# Patient Record
Sex: Female | Born: 1950 | Race: Black or African American | Hispanic: No | Marital: Married | Smoking: Former smoker
Health system: Southern US, Community
[De-identification: ages and names within clinical notes are randomized; demographics above are authoritative.]

## PROBLEM LIST (undated history)

## (undated) DIAGNOSIS — K56609 Unspecified intestinal obstruction, unspecified as to partial versus complete obstruction: Secondary | ICD-10-CM

## (undated) DIAGNOSIS — I10 Essential (primary) hypertension: Secondary | ICD-10-CM

## (undated) DIAGNOSIS — N186 End stage renal disease: Secondary | ICD-10-CM

## (undated) DIAGNOSIS — Z992 Dependence on renal dialysis: Secondary | ICD-10-CM

## (undated) DIAGNOSIS — I509 Heart failure, unspecified: Secondary | ICD-10-CM

## (undated) DIAGNOSIS — E785 Hyperlipidemia, unspecified: Secondary | ICD-10-CM

## (undated) DIAGNOSIS — E119 Type 2 diabetes mellitus without complications: Secondary | ICD-10-CM

## (undated) HISTORY — PX: COLONOSCOPY W/ POLYPECTOMY: SHX1380

---

## 2016-10-23 ENCOUNTER — Ambulatory Visit (HOSPITAL_COMMUNITY)
Admission: RE | Admit: 2016-10-23 | Discharge: 2016-10-23 | Disposition: A | Discharge status: Home / Self Care | Payer: Medicare Other | Source: Ambulatory Visit | Attending: Interventional Radiology | Admitting: Interventional Radiology

## 2016-10-23 ENCOUNTER — Ambulatory Visit (HOSPITAL_COMMUNITY)
Admission: RE | Admit: 2016-10-23 | Payer: Medicare Other | Source: Other Acute Inpatient Hospital | Admitting: Interventional Radiology

## 2016-10-23 ENCOUNTER — Encounter (HOSPITAL_COMMUNITY): Payer: Self-pay | Admitting: Interventional Radiology

## 2016-10-23 ENCOUNTER — Other Ambulatory Visit (HOSPITAL_COMMUNITY): Payer: Self-pay | Admitting: Interventional Radiology

## 2016-10-23 DIAGNOSIS — Z4901 Encounter for fitting and adjustment of extracorporeal dialysis catheter: Secondary | ICD-10-CM | POA: Diagnosis present

## 2016-10-23 DIAGNOSIS — Z992 Dependence on renal dialysis: Secondary | ICD-10-CM

## 2016-10-23 DIAGNOSIS — N186 End stage renal disease: Secondary | ICD-10-CM | POA: Diagnosis not present

## 2016-10-23 HISTORY — PX: IR REMOVAL TUN CV CATH W/O FL: IMG2289

## 2016-10-23 MED ORDER — CHLORHEXIDINE GLUCONATE 4 % EX LIQD
CUTANEOUS | Status: AC
  Filled 2016-10-23: qty 15

## 2016-10-23 MED ORDER — LIDOCAINE HCL (PF) 1 % IJ SOLN
INTRAMUSCULAR | Status: DC | PRN
  Administered 2016-10-23: 5 mL

## 2016-10-23 MED ORDER — LIDOCAINE HCL 1 % IJ SOLN
INTRAMUSCULAR | Status: AC
  Filled 2016-10-23: qty 20

## 2016-10-23 NOTE — Procedures (Signed)
Successful removal of tunneled HD catheter EBL: None No immediate complications.  Jay Samatha Anspach, MD Pager #: [phone]   

## 2016-11-29 ENCOUNTER — Emergency Department (HOSPITAL_BASED_OUTPATIENT_CLINIC_OR_DEPARTMENT_OTHER): Payer: Medicare Other

## 2016-11-29 ENCOUNTER — Encounter (HOSPITAL_BASED_OUTPATIENT_CLINIC_OR_DEPARTMENT_OTHER): Payer: Self-pay | Admitting: *Deleted

## 2016-11-29 ENCOUNTER — Emergency Department (HOSPITAL_BASED_OUTPATIENT_CLINIC_OR_DEPARTMENT_OTHER)
Admission: EM | Admit: 2016-11-29 | Discharge: 2016-11-29 | Disposition: A | Discharge status: Home / Self Care | Payer: Medicare Other | Attending: Emergency Medicine | Admitting: Emergency Medicine

## 2016-11-29 ENCOUNTER — Other Ambulatory Visit: Payer: Self-pay

## 2016-11-29 DIAGNOSIS — R05 Cough: Secondary | ICD-10-CM | POA: Diagnosis present

## 2016-11-29 DIAGNOSIS — I509 Heart failure, unspecified: Secondary | ICD-10-CM | POA: Diagnosis not present

## 2016-11-29 DIAGNOSIS — Z79899 Other long term (current) drug therapy: Secondary | ICD-10-CM | POA: Diagnosis not present

## 2016-11-29 DIAGNOSIS — E1122 Type 2 diabetes mellitus with diabetic chronic kidney disease: Secondary | ICD-10-CM | POA: Diagnosis not present

## 2016-11-29 DIAGNOSIS — I132 Hypertensive heart and chronic kidney disease with heart failure and with stage 5 chronic kidney disease, or end stage renal disease: Secondary | ICD-10-CM | POA: Diagnosis not present

## 2016-11-29 DIAGNOSIS — Z7982 Long term (current) use of aspirin: Secondary | ICD-10-CM | POA: Insufficient documentation

## 2016-11-29 DIAGNOSIS — J181 Lobar pneumonia, unspecified organism: Secondary | ICD-10-CM | POA: Insufficient documentation

## 2016-11-29 DIAGNOSIS — J189 Pneumonia, unspecified organism: Secondary | ICD-10-CM

## 2016-11-29 DIAGNOSIS — Z794 Long term (current) use of insulin: Secondary | ICD-10-CM | POA: Diagnosis not present

## 2016-11-29 DIAGNOSIS — R531 Weakness: Secondary | ICD-10-CM | POA: Diagnosis not present

## 2016-11-29 DIAGNOSIS — N186 End stage renal disease: Secondary | ICD-10-CM | POA: Diagnosis not present

## 2016-11-29 HISTORY — DX: Type 2 diabetes mellitus without complications: E11.9

## 2016-11-29 HISTORY — DX: Hyperlipidemia, unspecified: E78.5

## 2016-11-29 HISTORY — DX: Dependence on renal dialysis: Z99.2

## 2016-11-29 HISTORY — DX: End stage renal disease: N18.6

## 2016-11-29 HISTORY — DX: Unspecified intestinal obstruction, unspecified as to partial versus complete obstruction: K56.609

## 2016-11-29 HISTORY — DX: Heart failure, unspecified: I50.9

## 2016-11-29 HISTORY — DX: Essential (primary) hypertension: I10

## 2016-11-29 LAB — CBC WITH DIFFERENTIAL/PLATELET
BASOS ABS: 0.1 10*3/uL (ref 0.0–0.1)
BASOS PCT: 1 %
Eosinophils Absolute: 0.3 10*3/uL (ref 0.0–0.7)
Eosinophils Relative: 5 %
HEMATOCRIT: 34 % — AB (ref 36.0–46.0)
HEMOGLOBIN: 10.7 g/dL — AB (ref 12.0–15.0)
Lymphocytes Relative: 19 %
Lymphs Abs: 1.1 10*3/uL (ref 0.7–4.0)
MCH: 26.8 pg (ref 26.0–34.0)
MCHC: 31.5 g/dL (ref 30.0–36.0)
MCV: 85 fL (ref 78.0–100.0)
MONO ABS: 0.6 10*3/uL (ref 0.1–1.0)
Monocytes Relative: 11 %
NEUTROS ABS: 4 10*3/uL (ref 1.7–7.7)
NEUTROS PCT: 64 %
Platelets: 188 10*3/uL (ref 150–400)
RBC: 4 MIL/uL (ref 3.87–5.11)
RDW: 18.3 % — AB (ref 11.5–15.5)
WBC: 6.1 10*3/uL (ref 4.0–10.5)

## 2016-11-29 LAB — BASIC METABOLIC PANEL
ANION GAP: 12 (ref 5–15)
BUN: 23 mg/dL — AB (ref 6–20)
CO2: 24 mmol/L (ref 22–32)
Calcium: 8 mg/dL — ABNORMAL LOW (ref 8.9–10.3)
Chloride: 103 mmol/L (ref 101–111)
Creatinine, Ser: 8.68 mg/dL — ABNORMAL HIGH (ref 0.44–1.00)
GFR calc Af Amer: 5 mL/min — ABNORMAL LOW (ref >60)
GFR calc non Af Amer: 4 mL/min — ABNORMAL LOW (ref >60)
GLUCOSE: 111 mg/dL — AB (ref 65–99)
POTASSIUM: 3.4 mmol/L — AB (ref 3.5–5.1)
Sodium: 139 mmol/L (ref 135–145)

## 2016-11-29 LAB — TROPONIN I: Troponin I: <0.03 ng/mL (ref <0.03)

## 2016-11-29 LAB — BRAIN NATRIURETIC PEPTIDE: B NATRIURETIC PEPTIDE 5: 497.1 pg/mL — AB (ref 0.0–100.0)

## 2016-11-29 MED ORDER — IPRATROPIUM-ALBUTEROL 0.5-2.5 (3) MG/3ML IN SOLN
3.0000 mL | Freq: Once | RESPIRATORY_TRACT | Status: AC
  Administered 2016-11-29: 3 mL via RESPIRATORY_TRACT
  Filled 2016-11-29: qty 3

## 2016-11-29 MED ORDER — AMOXICILLIN-POT CLAVULANATE 500-125 MG PO TABS: 1.0000 | ORAL_TABLET | Freq: Every day | ORAL | 0 refills | Status: AC

## 2016-11-29 MED FILL — AMOX-CLAV 500-125 MG TABLET: 500-125 | 7 days supply | Qty: 7 | Fill #0

## 2016-11-29 NOTE — ED Notes (Signed)
IV attempt x1 unsuccessful right forearm.

## 2016-11-29 NOTE — ED Triage Notes (Signed)
Pt reports cough for over a month. States she has been feeling SOB, worse at night. Reports her chest "feels tight". Pt had dialysis on Saturday and is due again tomorrow. Pt has a dialysis catheter in her right chest

## 2016-11-29 NOTE — ED Provider Notes (Signed)
[facility] EMERGENCY DEPARTMENT Provider Note   CSN: [id number] Arrival date & time: 11/29/16  1045     History   Chief Complaint Chief Complaint  Patient presents with  . Cough    HPI Arantxa Piercey is a 66 y.o. female.  The history is provided by the patient. No language interpreter was used.  Cough    Samira Acero is a 66 y.o. female who presents to the Emergency Department complaining of sob/cough.  She reports 1 month of progressive shortness of breath and cough.  She endorses associated generalized weakness and malaise.  Breathing seems worse at night but is also present on exertion.  No reports of fevers.  She is having yellow sputum but today had some bloody specks in her sputum.  No chest pain, vomiting, abdominal pain, leg swelling or pain.  She does have end-stage renal disease and is on hemodialysis Tuesday, Thursday, Saturday.  Her last session was on Saturday and was a full session.  She has no improvement in her symptoms with dialysis. Past Medical History:  Diagnosis Date  . CHF (congestive heart failure) ([place])   . Diabetes mellitus without complication ([place])   . Dialysis patient ([place])   . ESRD (end stage renal disease) ([place])   . Hyperlipidemia   . Hypertension   . Intestinal obstruction ([facility])     There are no active problems to display for this patient.   Past Surgical History:  Procedure Laterality Date  . COLONOSCOPY W/ POLYPECTOMY    . IR REMOVAL TUN CV CATH W/O FL  10/23/2016    OB History    No data available       Home Medications    Prior to Admission medications   Medication Sig Start Date End Date Taking? Authorizing Provider  acetaminophen (TYLENOL) 325 MG tablet Take 650 mg every 6 (six) hours as needed by mouth.   Yes [provider]  aspirin 81 MG chewable tablet Chew daily by mouth.   Yes [provider]  atorvastatin (LIPITOR) 80 MG tablet Take 80 mg daily by mouth.   Yes [provider]  CALCIUM CARBONATE PO Take 1 tablet daily by mouth.   Yes [provider]  carvedilol (COREG) 12.5 MG tablet Take 12.5 mg 2 (two) times daily with a meal by mouth.   Yes [provider]  Cholecalciferol 2000 units CAPS Take 1 capsule daily by mouth.   Yes [provider]  insulin lispro (HUMALOG) 100 UNIT/ML injection Inject 3 (three) times daily before meals into the skin. Sliding Scale   Yes [provider]  levothyroxine (SYNTHROID, LEVOTHROID) 175 MCG tablet Take 175 mcg daily before breakfast by mouth.   Yes [provider]  lisinopril (PRINIVIL,ZESTRIL) 20 MG tablet Take 20 mg daily by mouth.   Yes [provider]  pantoprazole (PROTONIX) 40 MG tablet Take 40 mg daily by mouth.   Yes [provider]  sevelamer carbonate (RENVELA) 800 MG tablet Take 1,600 mg 3 (three) times daily with meals by mouth.   Yes [provider]  amoxicillin-clavulanate (AUGMENTIN) 500-125 MG tablet Take 1 tablet (500 mg total) daily for 7 days by mouth. 11/29/16 12/06/16  Quintella Reichert, MD    Family History No family history on file.  Social History Social History   Tobacco Use  . Smoking status: Not on file  Substance Use Topics  . Alcohol use: Not on file  . Drug use: Not on file  Allergies   Other and Tetracyclines & related   Review of Systems Review of Systems  Respiratory: Positive for cough.   All other systems reviewed and are negative.    Physical Exam Updated Vital Signs BP (!) 154/93   Pulse 77   Temp 98.1 F (36.7 C) (Oral)   Resp (!) 25   Ht 5\' 4"  (1.626 m)   Wt 116.6 kg (257 lb)   SpO2 94%   BMI 44.11 kg/m   Physical Exam  Constitutional: She is oriented to person, place, and time. She appears well-developed and well-nourished.  HENT:  Head: Normocephalic and atraumatic.  Cardiovascular: Normal rate and regular rhythm.  No murmur heard. Pulmonary/Chest: Effort normal. No respiratory  distress.  Vascular catheter in right anterior chest wall.  Decreased air movement in right lung fields.   Abdominal: Soft. There is no tenderness. There is no rebound and no guarding.  Musculoskeletal: She exhibits no tenderness.  Nonpitting edema to BLE  Neurological: She is alert and oriented to person, place, and time.  Skin: Skin is warm and dry.  Psychiatric: She has a normal mood and affect. Her behavior is normal.  Nursing note and vitals reviewed.    ED Treatments / Results  Labs (all labs ordered are listed, but only abnormal results are displayed) Labs Reviewed  BASIC METABOLIC PANEL - Abnormal; Notable for the following components:      Result Value   Potassium 3.4 (*)    Glucose, Bld 111 (*)    BUN 23 (*)    Creatinine, Ser 8.68 (*)    Calcium 8.0 (*)    GFR calc non Af Amer 4 (*)    GFR calc Af Amer 5 (*)    All other components within normal limits  CBC WITH DIFFERENTIAL/PLATELET - Abnormal; Notable for the following components:   Hemoglobin 10.7 (*)    HCT 34.0 (*)    RDW 18.3 (*)    All other components within normal limits  BRAIN NATRIURETIC PEPTIDE - Abnormal; Notable for the following components:   B Natriuretic Peptide 497.1 (*)    All other components within normal limits  TROPONIN I    EKG  EKG Interpretation  Date/Time:  Monday November 29 2016 11:23:03 EST Ventricular Rate:  77 PR Interval:    QRS Duration: 137 QT Interval:  420 QTC Calculation: 476 R Axis:   57 Text Interpretation:  Sinus rhythm Nonspecific intraventricular conduction delay Confirmed by Quintella Reichert [phone]) on 11/29/2016 11:28:05 AM Also confirmed by Quintella Reichert [phone]), editor Laurena Spies [phone])  on 11/29/2016 11:47:36 AM       Radiology Dg Chest 2 View  Result Date: 11/29/2016 CLINICAL DATA:  Cough for 1 month. EXAM: CHEST  2 VIEW COMPARISON:  None. FINDINGS: The heart is enlarged. Dual lumen catheter tips in the RIGHT atrium. Asymmetric opacity at the  RIGHT base appears to represent consolidation, concern for RIGHT lower lobe pneumonia. Small BILATERAL effusions could represent superimposed edema. Degenerative change thoracic spine. IMPRESSION: Cardiomegaly with mild edema. Suspected superimposed RIGHT lower lobe consolidation concerning for pneumonia. Electronically Signed   By: Staci Righter M.D.   On: 11/29/2016 11:36   Ct Chest Wo Contrast  Result Date: 11/29/2016 CLINICAL DATA:  Cough over 1 month. EXAM: CT CHEST WITHOUT CONTRAST TECHNIQUE: Multidetector CT imaging of the chest was performed following the standard protocol without IV contrast. COMPARISON:  11/29/2016 CXR FINDINGS: Cardiovascular: There is cardiomegaly with dialysis catheter tips in the distal SVC and right  atrium. No significant pericardial effusion or thickening. Minimal coronary arteriosclerosis along the LAD and RCA. Mediastinum/Nodes: 17 mm short axis right lower paratracheal lymphadenopathy possibly reactive with smaller AP window and right upper paratracheal lymph nodes. Mild aortic atherosclerosis at the arch without aneurysm. Lungs/Pleura: Patchy ground-glass infiltrates in the setting of mild centrilobular emphysema within both upper lobes, greatest near the right lung apex with subpleural areas of scarring and/or atelectasis along the periphery of the lingula, right middle lobe and both lower lobes. More consolidative pulmonary consolidation associated with a small right pleural effusion is seen within the right lower lobe. Trace left pleural effusion with adjacent left lower lobe atelectasis and/or scarring. Mild bronchiectasis in the lower lobes. No dominant mass or pneumothorax. Mild scattered subpleural areas of emphysema. Upper Abdomen: No acute abnormality. Musculoskeletal: Degenerative disc disease along the thoracic spine consistent thoracic spondylosis. No acute nor suspicious osseous abnormality. IMPRESSION: 1. Cardiomegaly with aortic atherosclerosis and minimal  coronary arteriosclerosis. 2. Bilateral pleural effusions, small on the right and trace on the left, with associated compressive atelectasis. Adjacent right lower lobe pneumonic consolidation is noted with more faint ground-glass infiltrates possibly representing stigmata of mild CHF or alveolitis/pneumonitis among some possibilities though not exclusive in both upper lobes, right worse left. 3. Mild subpleural emphysematous change. Aortic Atherosclerosis (ICD10-I70.0) and Emphysema (ICD10-J43.9). Electronically Signed   By: Ashley Royalty M.D.   On: 11/29/2016 13:46    Procedures Procedures (including critical care time)  Medications Ordered in ED Medications  ipratropium-albuterol (DUONEB) 0.5-2.5 (3) MG/3ML nebulizer solution 3 mL (3 mLs Nebulization Given 11/29/16 1111)     Initial Impression / Assessment and Plan / ED Course  I have reviewed the triage vital signs and the nursing notes.  Pertinent labs & imaging results that were available during my care of the patient were reviewed by me and considered in my medical decision making (see chart for details).    Patient with ESRD on hemodialysis here for 1 month of progressive dyspnea on exertion, cough productive of yellow sputum with a small speckling of blood today.  She does have associated generalized weakness.  Chest x-ray with right lower lobe infiltrate and she does have focal findings on lung examination.  CT chest obtained to further evaluate to look for underlying mass.  CT chest demonstrates pneumonia with evidence of CHF.  Clinical picture is not consistent with decompensated CHF, PE, ACS.  Labs demonstrate stable anemia.  Discussed with patient home care for pneumonia, ESRD.  Will treat with Augmentin with dosing for ESRD.  Discussed with patient importance of PCP as well as nephrology and cardiology follow-up.  Return precautions discussed. Final Clinical Impressions(s) / ED Diagnoses   Final diagnoses:  Community acquired  pneumonia of right lower lobe of lung [facility])  Asthenia    ED Discharge Orders        Ordered    amoxicillin-clavulanate (AUGMENTIN) 500-125 MG tablet  Daily     11/29/16 1405       Quintella Reichert, MD 11/29/16 [phone]

## 2020-10-11 ENCOUNTER — Emergency Department (HOSPITAL_BASED_OUTPATIENT_CLINIC_OR_DEPARTMENT_OTHER)
Admission: EM | Admit: 2020-10-11 | Discharge: 2020-10-11 | Disposition: A | Discharge status: Home / Self Care | Payer: Medicare Other | Attending: Emergency Medicine | Admitting: Emergency Medicine

## 2020-10-11 ENCOUNTER — Emergency Department (HOSPITAL_BASED_OUTPATIENT_CLINIC_OR_DEPARTMENT_OTHER): Payer: Medicare Other

## 2020-10-11 ENCOUNTER — Encounter (HOSPITAL_BASED_OUTPATIENT_CLINIC_OR_DEPARTMENT_OTHER): Payer: Self-pay | Admitting: Emergency Medicine

## 2020-10-11 ENCOUNTER — Other Ambulatory Visit: Payer: Self-pay

## 2020-10-11 DIAGNOSIS — Z992 Dependence on renal dialysis: Secondary | ICD-10-CM | POA: Insufficient documentation

## 2020-10-11 DIAGNOSIS — Z87891 Personal history of nicotine dependence: Secondary | ICD-10-CM | POA: Diagnosis not present

## 2020-10-11 DIAGNOSIS — H6022 Malignant otitis externa, left ear: Secondary | ICD-10-CM | POA: Insufficient documentation

## 2020-10-11 DIAGNOSIS — Z7982 Long term (current) use of aspirin: Secondary | ICD-10-CM | POA: Diagnosis not present

## 2020-10-11 DIAGNOSIS — E1122 Type 2 diabetes mellitus with diabetic chronic kidney disease: Secondary | ICD-10-CM | POA: Diagnosis not present

## 2020-10-11 DIAGNOSIS — H9202 Otalgia, left ear: Secondary | ICD-10-CM | POA: Diagnosis present

## 2020-10-11 DIAGNOSIS — N186 End stage renal disease: Secondary | ICD-10-CM | POA: Diagnosis not present

## 2020-10-11 DIAGNOSIS — Z79899 Other long term (current) drug therapy: Secondary | ICD-10-CM | POA: Insufficient documentation

## 2020-10-11 DIAGNOSIS — H65112 Acute and subacute allergic otitis media (mucoid) (sanguinous) (serous), left ear: Secondary | ICD-10-CM

## 2020-10-11 DIAGNOSIS — I509 Heart failure, unspecified: Secondary | ICD-10-CM | POA: Insufficient documentation

## 2020-10-11 DIAGNOSIS — I132 Hypertensive heart and chronic kidney disease with heart failure and with stage 5 chronic kidney disease, or end stage renal disease: Secondary | ICD-10-CM | POA: Diagnosis not present

## 2020-10-11 LAB — CBC WITH DIFFERENTIAL/PLATELET
Abs Immature Granulocytes: 0.16 10*3/uL — ABNORMAL HIGH (ref 0.00–0.07)
Basophils Absolute: 0.1 10*3/uL (ref 0.0–0.1)
Basophils Relative: 1 %
Eosinophils Absolute: 0.2 10*3/uL (ref 0.0–0.5)
Eosinophils Relative: 3 %
HCT: 37.3 % (ref 36.0–46.0)
Hemoglobin: 11.9 g/dL — ABNORMAL LOW (ref 12.0–15.0)
Immature Granulocytes: 2 %
Lymphocytes Relative: 24 %
Lymphs Abs: 1.8 10*3/uL (ref 0.7–4.0)
MCH: 28.5 pg (ref 26.0–34.0)
MCHC: 31.9 g/dL (ref 30.0–36.0)
MCV: 89.4 fL (ref 80.0–100.0)
Monocytes Absolute: 0.7 10*3/uL (ref 0.1–1.0)
Monocytes Relative: 10 %
Neutro Abs: 4.6 10*3/uL (ref 1.7–7.7)
Neutrophils Relative %: 60 %
Platelets: 266 10*3/uL (ref 150–400)
RBC: 4.17 MIL/uL (ref 3.87–5.11)
RDW: 14.9 % (ref 11.5–15.5)
WBC: 7.6 10*3/uL (ref 4.0–10.5)
nRBC: 0 % (ref 0.0–0.2)

## 2020-10-11 LAB — BASIC METABOLIC PANEL
Anion gap: 9 (ref 5–15)
BUN: 10 mg/dL (ref 8–23)
CO2: 30 mmol/L (ref 22–32)
Calcium: 8.8 mg/dL — ABNORMAL LOW (ref 8.9–10.3)
Chloride: 98 mmol/L (ref 98–111)
Creatinine, Ser: 4.21 mg/dL — ABNORMAL HIGH (ref 0.44–1.00)
GFR, Estimated: 11 mL/min — ABNORMAL LOW (ref >60)
Glucose, Bld: 92 mg/dL (ref 70–99)
Potassium: 3.7 mmol/L (ref 3.5–5.1)
Sodium: 137 mmol/L (ref 135–145)

## 2020-10-11 LAB — CBG MONITORING, ED: Glucose-Capillary: 110 mg/dL — ABNORMAL HIGH (ref 70–99)

## 2020-10-11 LAB — SEDIMENTATION RATE: Sed Rate: 80 mm/hr — ABNORMAL HIGH (ref 0–22)

## 2020-10-11 MED ORDER — CIPROFLOXACIN HCL 500 MG PO TABS
500.0000 mg | ORAL_TABLET | Freq: Once | ORAL | Status: AC
  Administered 2020-10-11: 500 mg via ORAL
  Filled 2020-10-11: qty 1

## 2020-10-11 MED ORDER — CIPROFLOXACIN-DEXAMETHASONE 0.3-0.1 % OT SUSP: 4.0000 [drp] | Freq: Two times a day (BID) | OTIC | 0 refills | Status: AC

## 2020-10-11 MED ORDER — CIPROFLOXACIN HCL 500 MG PO TABS: 500.0000 mg | ORAL_TABLET | Freq: Every day | ORAL | 0 refills | Status: AC

## 2020-10-11 MED ORDER — CIPROFLOXACIN-DEXAMETHASONE 0.3-0.1 % OT SUSP
4.0000 [drp] | Freq: Once | OTIC | Status: AC
  Administered 2020-10-11: 4 [drp] via OTIC
  Filled 2020-10-11: qty 7.5

## 2020-10-11 NOTE — ED Triage Notes (Signed)
Pt c/o LT ear pain x 1 wk

## 2020-10-11 NOTE — ED Notes (Signed)
Patient Alert and oriented to baseline. Stable and ambulatory to baseline. Patient verbalized understanding of the discharge instructions.  Patient belongings were taken by the patient.   

## 2020-10-11 NOTE — ED Provider Notes (Signed)
[facility] EMERGENCY DEPARTMENT Provider Note  CSN: [id number] Arrival date & time: 10/11/20 1117    History Chief Complaint  Patient presents with   Otalgia    Tracy Graham is a 70 y.o. female with history of DM, ESRD on HD reports several days of increasing pain in left ear, some purulent drainage and decreased hearing.    Past Medical History:  Diagnosis Date   CHF (congestive heart failure) (HCC)    Diabetes mellitus without complication ([place])    Dialysis patient ([place])    ESRD (end stage renal disease) ([place])    Hyperlipidemia    Hypertension    Intestinal obstruction ([place])     Past Surgical History:  Procedure Laterality Date   COLONOSCOPY W/ POLYPECTOMY     IR REMOVAL TUN CV CATH W/O FL  10/23/2016    No family history on file.  Social History   Tobacco Use   Smoking status: Former    Types: Cigarettes   Smokeless tobacco: Never  Substance Use Topics   Alcohol use: Not Currently   Drug use: Never     Home Medications Prior to Admission medications   Medication Sig Start Date End Date Taking? Authorizing Provider  ciprofloxacin (CIPRO) 500 MG tablet Take 1 tablet (500 mg total) by mouth daily in the afternoon for 10 days. 10/11/20 10/21/20 Yes Truddie Hidden, MD  ciprofloxacin-dexamethasone (CIPRODEX) OTIC suspension Place 4 drops into the left ear 2 (two) times daily. 10/11/20  Yes Truddie Hidden, MD  acetaminophen (TYLENOL) 325 MG tablet Take 650 mg every 6 (six) hours as needed by mouth.    [provider]  aspirin 81 MG chewable tablet Chew daily by mouth.    [provider]  atorvastatin (LIPITOR) 80 MG tablet Take 80 mg daily by mouth.    [provider]  CALCIUM CARBONATE PO Take 1 tablet daily by mouth.    [provider]  carvedilol (COREG) 12.5 MG tablet Take 12.5 mg 2 (two) times daily with a meal by mouth.    [provider]  Cholecalciferol 2000 units CAPS Take 1 capsule daily  by mouth.    [provider]  insulin lispro (HUMALOG) 100 UNIT/ML injection Inject 3 (three) times daily before meals into the skin. Sliding Scale    [provider]  levothyroxine (SYNTHROID, LEVOTHROID) 175 MCG tablet Take 175 mcg daily before breakfast by mouth.    [provider]  lisinopril (PRINIVIL,ZESTRIL) 20 MG tablet Take 20 mg daily by mouth.    [provider]  pantoprazole (PROTONIX) 40 MG tablet Take 40 mg daily by mouth.    [provider]  sevelamer carbonate (RENVELA) 800 MG tablet Take 1,600 mg 3 (three) times daily with meals by mouth.    [provider]     Allergies    Other and Tetracyclines & related   Review of Systems   Review of Systems A comprehensive review of systems was completed and negative except as noted in HPI.    Physical Exam BP (!) 154/82   Pulse 73   Temp 97.6 F (36.4 C) (Oral)   Resp 18   Ht '5\' 2"'$  (1.575 m)   Wt 114.3 kg   SpO2 100%   BMI 46.09 kg/m   Physical Exam Vitals and nursing note reviewed.  Constitutional:      Appearance: Normal appearance.  HENT:     Head: Normocephalic and atraumatic.     Ears:  Comments: Marked erythema and induration of L canal which is totally occluded, tender to palpation, erythema extends to the pinna    Nose: Nose normal.     Mouth/Throat:     Mouth: Mucous membranes are moist.  Eyes:     Extraocular Movements: Extraocular movements intact.     Conjunctiva/sclera: Conjunctivae normal.  Cardiovascular:     Rate and Rhythm: Normal rate.  Pulmonary:     Effort: Pulmonary effort is normal.     Breath sounds: Normal breath sounds.     Comments: Dialysis catheter in R upper chest Abdominal:     General: Abdomen is flat.     Palpations: Abdomen is soft.     Tenderness: There is no abdominal tenderness.  Musculoskeletal:        General: No swelling. Normal range of motion.     Cervical back: Neck supple.  Skin:    General: Skin is warm  and dry.  Neurological:     General: No focal deficit present.     Mental Status: She is alert.  Psychiatric:        Mood and Affect: Mood normal.     ED Results / Procedures / Treatments   Labs (all labs ordered are listed, but only abnormal results are displayed) Labs Reviewed  BASIC METABOLIC PANEL - Abnormal; Notable for the following components:      Result Value   Creatinine, Ser 4.21 (*)    Calcium 8.8 (*)    GFR, Estimated 11 (*)    All other components within normal limits  SEDIMENTATION RATE - Abnormal; Notable for the following components:   Sed Rate 80 (*)    All other components within normal limits  CBC WITH DIFFERENTIAL/PLATELET - Abnormal; Notable for the following components:   Hemoglobin 11.9 (*)    Abs Immature Granulocytes 0.16 (*)    All other components within normal limits  CBG MONITORING, ED - Abnormal; Notable for the following components:   Glucose-Capillary 110 (*)    All other components within normal limits    EKG None   Radiology CT Temporal Bones Wo Contrast  Result Date: 10/11/2020 CLINICAL DATA:  Mastoiditis.  Left ear pain for 1 week. EXAM: CT TEMPORAL BONES WITHOUT CONTRAST TECHNIQUE: Axial and coronal plane CT imaging of the petrous temporal bones was performed with thin-collimation image reconstruction. No intravenous contrast was administered. Multiplanar CT image reconstructions were also generated. COMPARISON:  No pertinent prior exam. FINDINGS: RIGHT TEMPORAL BONE External auditory canal: Normal. Middle ear cavity: Normally aerated. The scutum and ossicles are normal. The tegmen tympani is intact. Inner ear structures: The cochlea and vestibule are unremarkable. There is thin or absent bone covering the superior aspect of the superior surface circular canal. Internal auditory and facial nerve canals:  Unremarkable. Mastoid air cells: Small to moderate mastoid effusion. LEFT TEMPORAL BONE External auditory canal: Soft tissue thickening  severely narrowing/focally completely effacing the cartilaginous external auditory canal. Milder soft tissue thickening involving the bony EAC without osseous erosion. Middle ear cavity: Subtotal opacification of the middle ear cavity by fluid and/or soft tissue. No definite erosion of the scutum or ossicles. Inner ear structures: The cochlea, vestibule and semicircular canals are normal. The vestibular aqueduct is not enlarged. Internal auditory and facial nerve canals:  Unremarkable. Mastoid air cells: Complete opacification of the mastoid air cells without coalescence. Vascular: Normal non-contrast appearance of the carotid canals, jugular bulbs and sigmoid plates. Limited intracranial:  Unremarkable. Visible orbits/paranasal sinuses: Unremarkable orbits. The  included paranasal sinuses are clear. Soft tissues: Unremarkable. IMPRESSION: 1. Large left mastoid effusion and subtotal opacification of the left middle ear which could reflect otomastoiditis. No air cell coalescence or other destructive osseous changes. 2. Soft tissue thickening in the left external auditory canal. Correlate with direct visualization to assess for an infectious/inflammatory etiology such as otitis externa or less likely neoplasm. 3. Small to moderate right mastoid effusion. 4. Thin or possibly dehiscent bone covering the right superior surface circular canal. Electronically Signed   By: Logan Bores M.D.   On: 10/11/2020 13:17    Procedures Procedures  Medications Ordered in the ED Medications  ciprofloxacin-dexamethasone (CIPRODEX) 0.3-0.1 % OTIC (EAR) suspension 4 drop (has no administration in time range)  ciprofloxacin (CIPRO) tablet 500 mg (has no administration in time range)     MDM Rules/Calculators/A&P MDM Patient with severe otitis externa, given her h/o DM and ESRD, concern for malignant otitis. Will check labs and send for CT.   ED Course  I have reviewed the triage vital signs and the nursing  notes.  Pertinent labs & imaging results that were available during my care of the patient were reviewed by me and considered in my medical decision making (see chart for details).  Clinical Course as of 10/11/20 1420  Sat Oct 11, 2020  1246 CBC with normal WBC [CS]  1302 BMP consistent with history of ESRD.  [CS]  [id number] Sed rate is elevated. CT images and results reviewed, discussed with Dr. Constance Holster on call for ENT who recommends oral Cipro and Ciprodex drops with ear wick and close follow up in his office in 2 days. She was advised to take the Cipro once daily as ESRD dosing and to make sure she takes it after dialysis on those days.  [CS]    Clinical Course User Index [CS] Truddie Hidden, MD    Final Clinical Impression(s) / ED Diagnoses Final diagnoses:  Acute malignant otitis externa of left ear  Non-recurrent acute allergic otitis media of left ear    Rx / DC Orders ED Discharge Orders          Ordered    ciprofloxacin (CIPRO) 500 MG tablet  Daily        10/11/20 1419    ciprofloxacin-dexamethasone (CIPRODEX) OTIC suspension  2 times daily        10/11/20 1419             Truddie Hidden, MD 10/11/20 1420

## 2021-10-05 IMAGING — CT CT TEMPORAL BONES W/O CM
2 of 6 series · 13 of 40 positions shown, 16 images · non-contrast
Comparison: No pertinent prior exam.

CLINICAL DATA: Mastoiditis.  Left ear pain for 1 week.

EXAM:
CT TEMPORAL BONES WITHOUT CONTRAST
TECHNIQUE: Axial and coronal plane CT imaging of the petrous temporal bones was
performed with thin-collimation image reconstruction. No intravenous
contrast was administered. Multiplanar CT image reconstructions were
also generated.

[Series 6: ax mag right · axial · 0.20mm/px · z∈[-401,-356]mm · 11 of 90 slices shown, 14 images]
[im 8/90  brain]
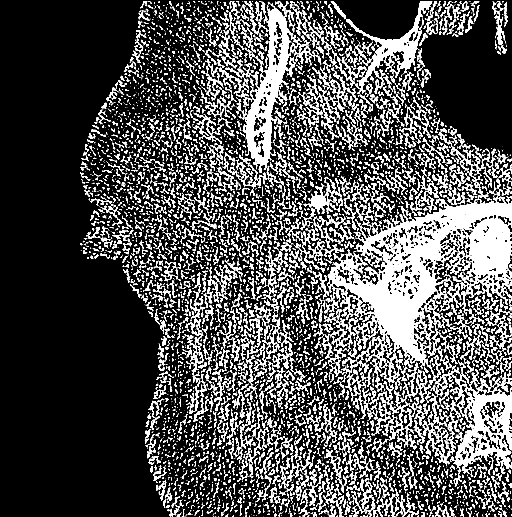
[im 8/90  bone]
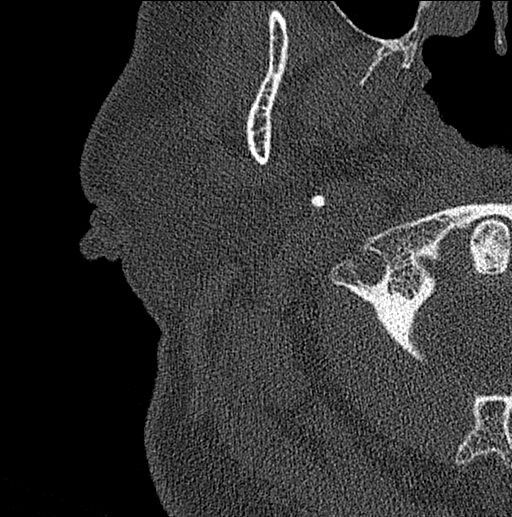
[im 15/90  bone]
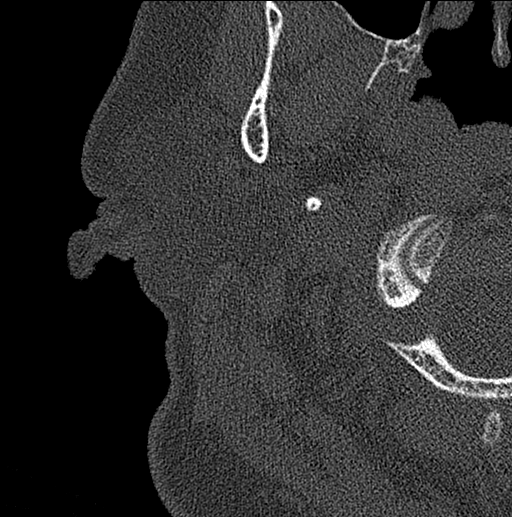
[im 23/90  bone]
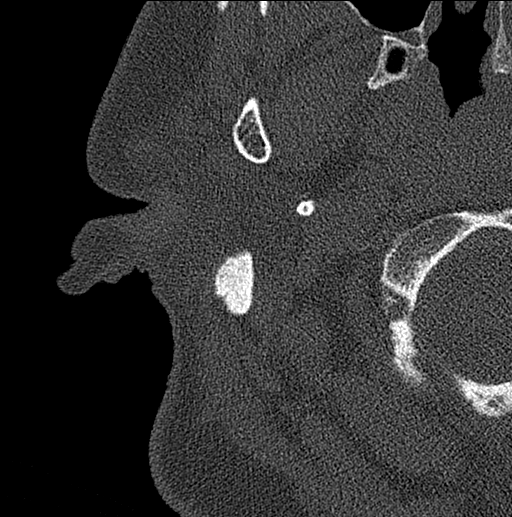
[im 30/90  bone]
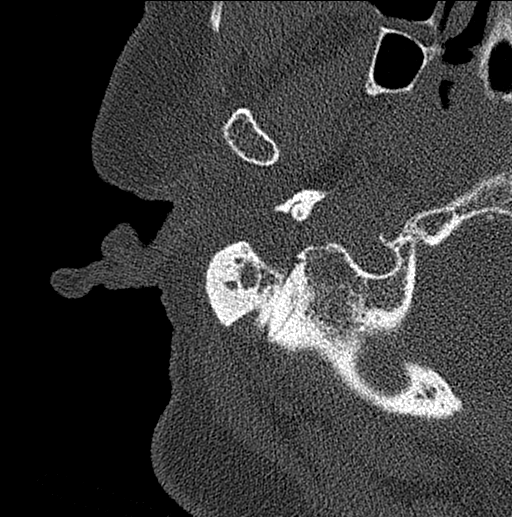
[im 38/90  brain]
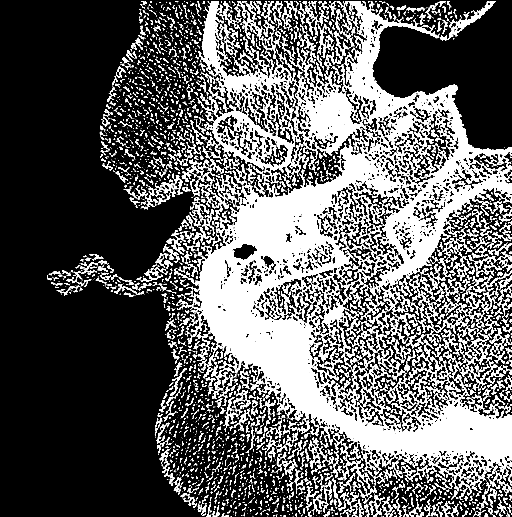
[im 38/90  bone]
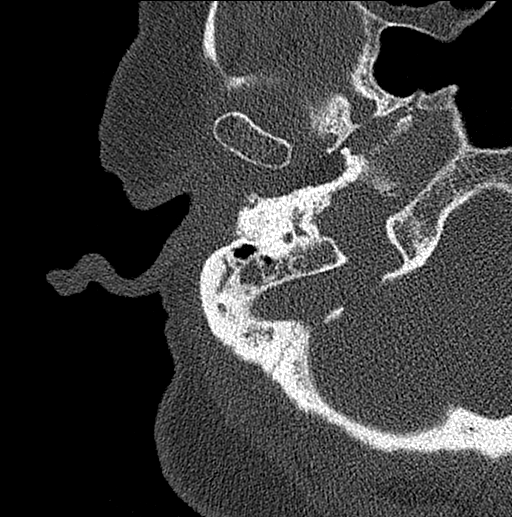
[im 45/90  bone]
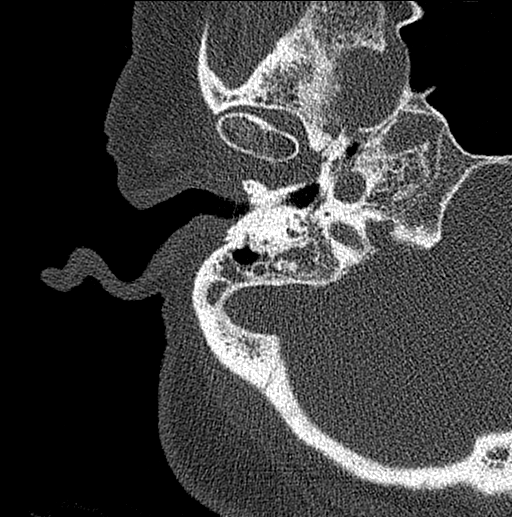
[im 52/90  bone]
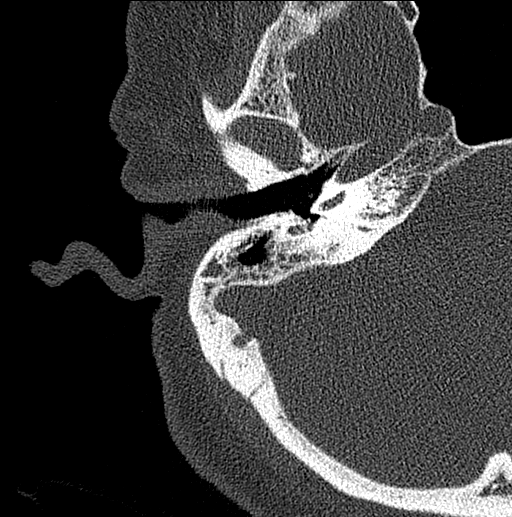
[im 60/90  bone]
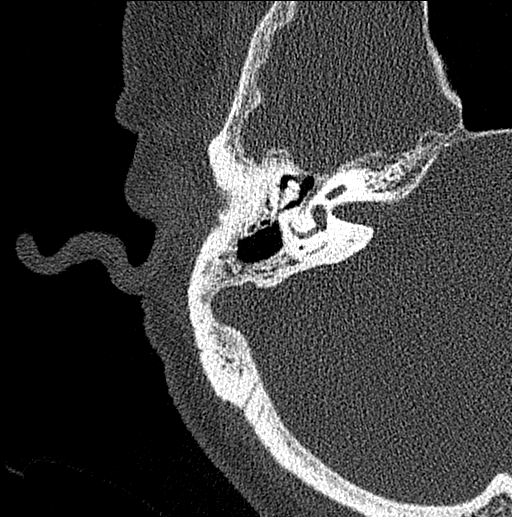
[im 67/90  brain]
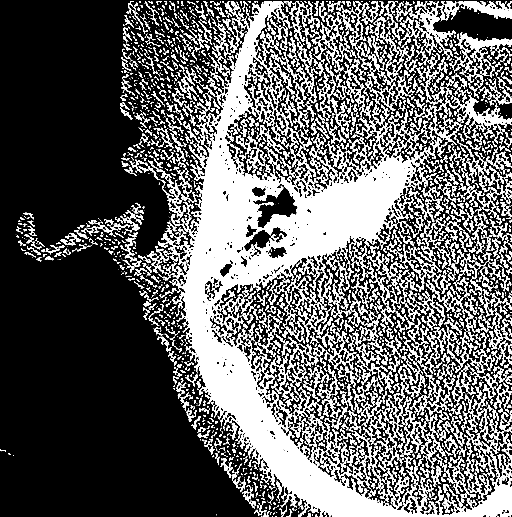
[im 67/90  bone]
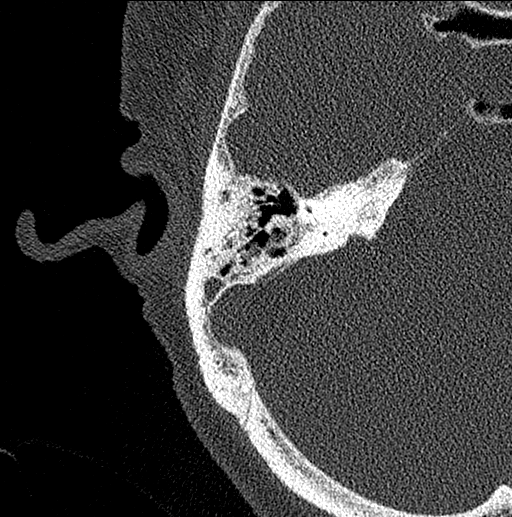
[im 75/90  bone]
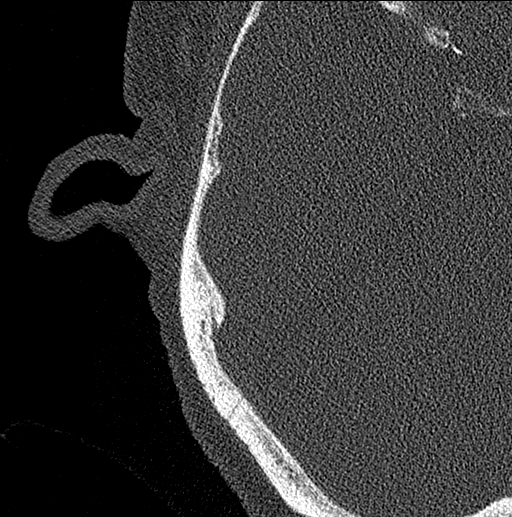
[im 82/90  bone]
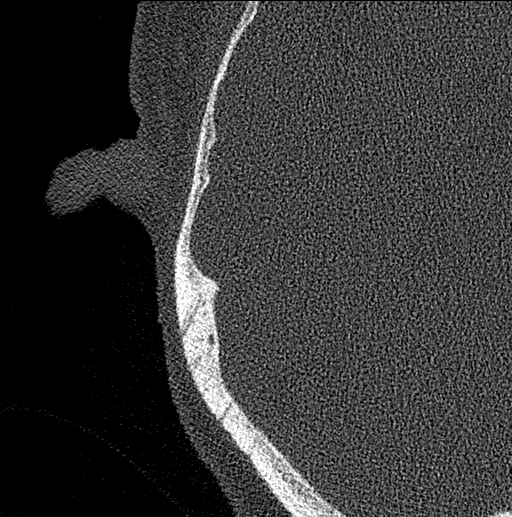

[Series 9: cor mag left · coronal · 0.12mm/px · 2 of 174 slices shown]
[im 58/174  bone]
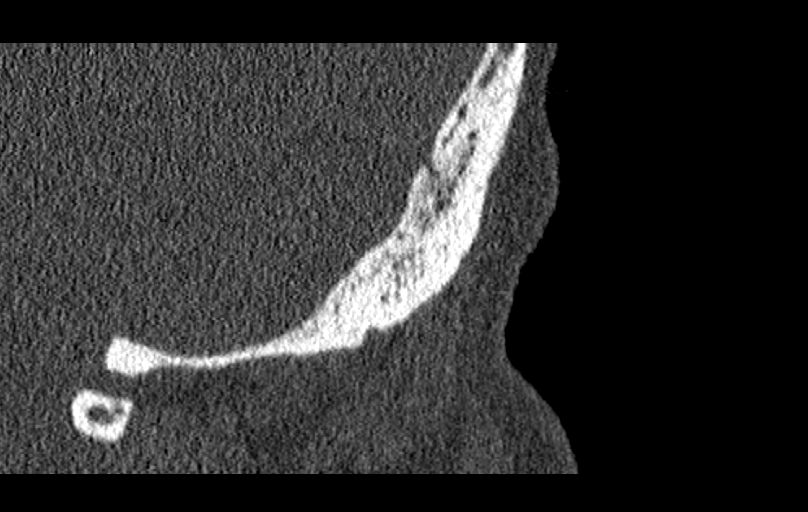
[im 116/174  bone]
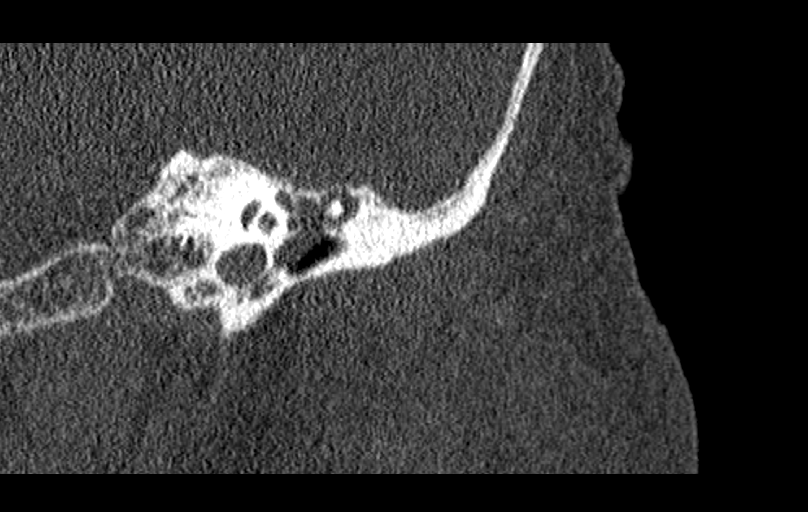

[13 of 40 positions shown; findings below may reference images not displayed]

FINDINGS: RIGHT TEMPORAL BONE

External auditory canal: Normal.

Middle ear cavity: Normally aerated. The scutum and ossicles are
normal. The tegmen tympani is intact.

Inner ear structures: The cochlea and vestibule are unremarkable.
There is thin or absent bone covering the superior aspect of the
superior surface circular canal.

Internal auditory and facial nerve canals:  Unremarkable.

Mastoid air cells: Small to moderate mastoid effusion.

LEFT TEMPORAL BONE

External auditory canal: Soft tissue thickening severely
narrowing/focally completely effacing the cartilaginous external
auditory canal. Milder soft tissue thickening involving the bony EAC
without osseous erosion.

Middle ear cavity: Subtotal opacification of the middle ear cavity
by fluid and/or soft tissue. No definite erosion of the scutum or
ossicles.

Inner ear structures: The cochlea, vestibule and semicircular canals
are normal. The vestibular aqueduct is not enlarged.

Internal auditory and facial nerve canals:  Unremarkable.

Mastoid air cells: Complete opacification of the mastoid air cells
without coalescence.

Vascular: Normal non-contrast appearance of the carotid canals,
jugular bulbs and sigmoid plates.

Limited intracranial:  Unremarkable.

Visible orbits/paranasal sinuses: Unremarkable orbits. The included
paranasal sinuses are clear.

Soft tissues: Unremarkable.
IMPRESSION: 1. Large left mastoid effusion and subtotal opacification of the
left middle ear which could reflect otomastoiditis. No air cell
coalescence or other destructive osseous changes.
2. Soft tissue thickening in the left external auditory canal.
Correlate with direct visualization to assess for an
infectious/inflammatory etiology such as otitis externa or less
likely neoplasm.
3. Small to moderate right mastoid effusion.
4. Thin or possibly dehiscent bone covering the right superior
surface circular canal.
